# Patient Record
Sex: Male | Born: 1970 | Race: Black or African American | Hispanic: No | Smoking: Current every day smoker
Health system: Southern US, Community
[De-identification: ages and names within clinical notes are randomized; demographics above are authoritative.]

## PROBLEM LIST (undated history)

## (undated) DIAGNOSIS — I219 Acute myocardial infarction, unspecified: Secondary | ICD-10-CM

---

## 1998-02-16 ENCOUNTER — Emergency Department (HOSPITAL_COMMUNITY): Admission: EM | Admit: 1998-02-16 | Discharge: 1998-02-16 | Payer: Self-pay | Admitting: Emergency Medicine

## 1998-10-10 ENCOUNTER — Emergency Department (HOSPITAL_COMMUNITY): Admission: EM | Admit: 1998-10-10 | Discharge: 1998-10-10 | Payer: Self-pay | Admitting: Emergency Medicine

## 1999-08-07 ENCOUNTER — Inpatient Hospital Stay (HOSPITAL_COMMUNITY): Admission: EM | Admit: 1999-08-07 | Discharge: 1999-08-07 | Payer: Self-pay | Admitting: Emergency Medicine

## 2003-09-25 ENCOUNTER — Emergency Department (HOSPITAL_COMMUNITY): Admission: AD | Admit: 2003-09-25 | Discharge: 2003-09-26 | Payer: Self-pay | Admitting: Emergency Medicine

## 2007-01-08 ENCOUNTER — Emergency Department (HOSPITAL_COMMUNITY): Admission: EM | Admit: 2007-01-08 | Discharge: 2007-01-08 | Payer: Self-pay | Admitting: Emergency Medicine

## 2007-01-27 ENCOUNTER — Emergency Department (HOSPITAL_COMMUNITY): Admission: EM | Admit: 2007-01-27 | Discharge: 2007-01-27 | Payer: Self-pay | Admitting: Emergency Medicine

## 2007-03-31 ENCOUNTER — Emergency Department (HOSPITAL_COMMUNITY): Admission: EM | Admit: 2007-03-31 | Discharge: 2007-03-31 | Payer: Self-pay | Admitting: Family Medicine

## 2008-07-04 ENCOUNTER — Emergency Department (HOSPITAL_COMMUNITY): Admission: EM | Admit: 2008-07-04 | Discharge: 2008-07-04 | Payer: Self-pay | Admitting: Emergency Medicine

## 2009-05-14 ENCOUNTER — Emergency Department (HOSPITAL_COMMUNITY): Admission: EM | Admit: 2009-05-14 | Discharge: 2009-05-14 | Payer: Self-pay | Admitting: Emergency Medicine

## 2009-05-17 ENCOUNTER — Emergency Department (HOSPITAL_COMMUNITY): Admission: EM | Admit: 2009-05-17 | Discharge: 2009-05-17 | Payer: Self-pay | Admitting: Emergency Medicine

## 2009-06-02 ENCOUNTER — Emergency Department (HOSPITAL_COMMUNITY): Admission: EM | Admit: 2009-06-02 | Discharge: 2009-06-02 | Payer: Self-pay | Admitting: Emergency Medicine

## 2009-06-07 ENCOUNTER — Emergency Department (HOSPITAL_COMMUNITY): Admission: EM | Admit: 2009-06-07 | Discharge: 2009-06-07 | Payer: Self-pay | Admitting: Emergency Medicine

## 2009-06-16 ENCOUNTER — Emergency Department (HOSPITAL_COMMUNITY): Admission: EM | Admit: 2009-06-16 | Discharge: 2009-06-16 | Payer: Self-pay | Admitting: Emergency Medicine

## 2010-11-14 LAB — DIFFERENTIAL
Basophils Absolute: 0 10*3/uL (ref 0.0–0.1)
Basophils Absolute: 0 10*3/uL (ref 0.0–0.1)
Basophils Relative: 0 % (ref 0–1)
Basophils Relative: 0 % (ref 0–1)
Lymphocytes Relative: 34 % (ref 12–46)
Monocytes Absolute: 0.5 10*3/uL (ref 0.1–1.0)
Neutro Abs: 3.8 10*3/uL (ref 1.7–7.7)
Neutro Abs: 6.4 10*3/uL (ref 1.7–7.7)
Neutrophils Relative %: 63 % (ref 43–77)

## 2010-11-14 LAB — COMPREHENSIVE METABOLIC PANEL
Albumin: 4 g/dL (ref 3.5–5.2)
Alkaline Phosphatase: 49 U/L (ref 39–117)
BUN: 11 mg/dL (ref 6–23)
CO2: 24 mEq/L (ref 19–32)
Chloride: 104 mEq/L (ref 96–112)
GFR calc non Af Amer: >60 mL/min (ref >60)
Glucose, Bld: 103 mg/dL — ABNORMAL HIGH (ref 70–99)
Potassium: 3.9 mEq/L (ref 3.5–5.1)
Total Bilirubin: 1.5 mg/dL — ABNORMAL HIGH (ref 0.3–1.2)

## 2010-11-14 LAB — CBC
HCT: 46.4 % (ref 39.0–52.0)
Hemoglobin: 16.1 g/dL (ref 13.0–17.0)
MCHC: 34.3 g/dL (ref 30.0–36.0)
MCV: 90 fL (ref 78.0–100.0)
RBC: 5.08 MIL/uL (ref 4.22–5.81)
RBC: 5.15 MIL/uL (ref 4.22–5.81)
RDW: 13.4 % (ref 11.5–15.5)
WBC: 8.2 10*3/uL (ref 4.0–10.5)

## 2010-11-14 LAB — LIPASE, BLOOD: Lipase: 18 U/L (ref 11–59)

## 2010-11-14 LAB — URINALYSIS, ROUTINE W REFLEX MICROSCOPIC
Bilirubin Urine: NEGATIVE
Hgb urine dipstick: NEGATIVE
Specific Gravity, Urine: 1.018 (ref 1.005–1.030)
Urobilinogen, UA: 0.2 mg/dL (ref 0.0–1.0)
pH: 6.5 (ref 5.0–8.0)

## 2010-11-14 LAB — POCT I-STAT, CHEM 8
BUN: 10 mg/dL (ref 6–23)
Chloride: 107 mEq/L (ref 96–112)
HCT: 50 % (ref 39.0–52.0)
Potassium: 3.5 mEq/L (ref 3.5–5.1)

## 2010-11-14 LAB — HEMOCCULT GUIAC POC 1CARD (OFFICE): Fecal Occult Bld: POSITIVE

## 2010-11-14 LAB — PROTIME-INR: INR: 1.09 (ref 0.00–1.49)

## 2011-03-07 ENCOUNTER — Emergency Department (HOSPITAL_COMMUNITY): Payer: Self-pay

## 2011-03-07 ENCOUNTER — Emergency Department (HOSPITAL_COMMUNITY)
Admission: EM | Admit: 2011-03-07 | Discharge: 2011-03-08 | Disposition: A | Discharge status: Home / Self Care | Payer: No Typology Code available for payment source | Attending: Emergency Medicine | Admitting: Emergency Medicine

## 2011-03-07 DIAGNOSIS — M545 Low back pain, unspecified: Secondary | ICD-10-CM | POA: Insufficient documentation

## 2011-03-07 DIAGNOSIS — R42 Dizziness and giddiness: Secondary | ICD-10-CM | POA: Insufficient documentation

## 2011-03-07 DIAGNOSIS — M25529 Pain in unspecified elbow: Secondary | ICD-10-CM | POA: Insufficient documentation

## 2011-03-07 DIAGNOSIS — R404 Transient alteration of awareness: Secondary | ICD-10-CM | POA: Insufficient documentation

## 2011-03-07 DIAGNOSIS — IMO0002 Reserved for concepts with insufficient information to code with codable children: Secondary | ICD-10-CM | POA: Insufficient documentation

## 2011-03-07 DIAGNOSIS — M25579 Pain in unspecified ankle and joints of unspecified foot: Secondary | ICD-10-CM | POA: Insufficient documentation

## 2011-03-07 DIAGNOSIS — R51 Headache: Secondary | ICD-10-CM | POA: Insufficient documentation

## 2011-03-07 DIAGNOSIS — M542 Cervicalgia: Secondary | ICD-10-CM | POA: Insufficient documentation

## 2011-07-19 ENCOUNTER — Encounter: Payer: Self-pay | Admitting: *Deleted

## 2011-07-19 ENCOUNTER — Emergency Department (INDEPENDENT_AMBULATORY_CARE_PROVIDER_SITE_OTHER)
Admission: EM | Admit: 2011-07-19 | Discharge: 2011-07-19 | Disposition: A | Discharge status: Home / Self Care | Payer: Self-pay | Source: Home / Self Care | Attending: Emergency Medicine | Admitting: Emergency Medicine

## 2011-07-19 DIAGNOSIS — R42 Dizziness and giddiness: Secondary | ICD-10-CM

## 2011-07-19 DIAGNOSIS — R030 Elevated blood-pressure reading, without diagnosis of hypertension: Secondary | ICD-10-CM

## 2011-07-19 DIAGNOSIS — J069 Acute upper respiratory infection, unspecified: Secondary | ICD-10-CM

## 2011-07-19 LAB — POCT I-STAT, CHEM 8
BUN: 10 mg/dL (ref 6–23)
Calcium, Ion: 1.19 mmol/L (ref 1.12–1.32)
Creatinine, Ser: 1.1 mg/dL (ref 0.50–1.35)
Hemoglobin: 18 g/dL — ABNORMAL HIGH (ref 13.0–17.0)
TCO2: 28 mmol/L (ref 0–100)

## 2011-07-19 NOTE — ED Notes (Signed)
Pt with onset of cough congestion and fever Wednesday - fever resolved - now with mild cough and congestion and dizziness when bending over - pt requires note to return to work

## 2011-07-19 NOTE — ED Provider Notes (Signed)
History     CSN: [id number] Arrival date & time: 07/19/2011  9:55 AM   First MD Initiated Contact with Patient 07/19/11 [phone]      Chief Complaint  Patient presents with  . Cough  . Nasal Congestion  . Dizziness    (Consider location/radiation/quality/duration/timing/severity/associated sxs/prior treatment) HPI Comments: I feel the congestion its better, still coughing a bit last fevers was yesterday 100. Something" No SOB Just feel a bit dizzy when I move my head! Or bend over  Patient is a 40 y.o. male presenting with cough. The history is provided by the patient.  Cough This is a new problem. The current episode started 2 days ago. The problem occurs every few hours. The cough is non-productive. The maximum temperature recorded prior to his arrival was 100 to 100.9 F. Associated symptoms include ear congestion, ear pain and sore throat. Pertinent negatives include no chest pain, no chills, no sweats, no headaches, no rhinorrhea, no myalgias, no shortness of breath and no wheezing. Associated symptoms comments: Dizzy or things spinning with head movement and bending over". He has tried decongestants for the symptoms. The treatment provided mild relief. His past medical history does not include pneumonia, COPD or asthma.    History reviewed. No pertinent past medical history.  History reviewed. No pertinent past surgical history.  History reviewed. No pertinent family history.  History  Substance Use Topics  . Smoking status: Current Everyday Smoker  . Smokeless tobacco: Not on file  . Alcohol Use: Yes      Review of Systems  Constitutional: Negative for chills.  HENT: Positive for ear pain and sore throat. Negative for rhinorrhea.   Respiratory: Positive for cough. Negative for shortness of breath and wheezing.   Cardiovascular: Negative for chest pain and palpitations.  Musculoskeletal: Negative for myalgias.  Neurological: Positive for dizziness. Negative for tremors,  syncope, weakness, numbness and headaches.    Allergies  Review of patient's allergies indicates no known allergies.  Home Medications   No current outpatient prescriptions on file.  BP 136/102  Pulse 73  Temp 98.6 F (37 C)  Resp 16  SpO2 99%  Physical Exam  Constitutional: He is oriented to person, place, and time. He appears well-developed.  HENT:  Head: Normocephalic.  Right Ear: Tympanic membrane normal.  Left Ear: Tympanic membrane normal.  Nose: Nose normal.  Mouth/Throat: Uvula is midline, oropharynx is clear and moist and mucous membranes are normal.  Eyes: Pupils are equal, round, and reactive to light.  Neck: Normal range of motion. No JVD present. No thyromegaly present.  Cardiovascular: Normal rate, regular rhythm and normal heart sounds.  PMI is not displaced.   No murmur heard. Pulmonary/Chest: Effort normal and breath sounds normal. No respiratory distress. He has no decreased breath sounds. He has no wheezes. He has no rhonchi. He has no rales. He exhibits no tenderness.  Abdominal: Soft.  Musculoskeletal: Normal range of motion.  Neurological: He is alert and oriented to person, place, and time. He has normal strength. No cranial nerve deficit or sensory deficit. Coordination normal.  Skin: Skin is warm. He is not diaphoretic. No erythema.    ED Course  Procedures (including critical care time)  Labs Reviewed  POCT I-STAT, CHEM 8 - Abnormal; Notable for the following:    Glucose, Bld 105 (*)    Hemoglobin 18.0 (*)    HCT 53.0 (*)    All other components within normal limits  I-STAT, CHEM 8   No results found.  1. Upper respiratory infection   2. Vertigo   3. Elevated blood pressure       MDM  Resolving respiratory symptoms. Now feeling dizzy. Possibly mild dehydration vs peripheral vertigo.        Jimmie Molly, MD 07/19/11 1155

## 2012-03-31 ENCOUNTER — Emergency Department (HOSPITAL_COMMUNITY)
Admission: EM | Admit: 2012-03-31 | Discharge: 2012-03-31 | Disposition: A | Discharge status: Home / Self Care | Payer: Self-pay | Attending: Emergency Medicine | Admitting: Emergency Medicine

## 2012-03-31 ENCOUNTER — Encounter (HOSPITAL_COMMUNITY): Payer: Self-pay | Admitting: *Deleted

## 2012-03-31 DIAGNOSIS — S61409A Unspecified open wound of unspecified hand, initial encounter: Secondary | ICD-10-CM | POA: Insufficient documentation

## 2012-03-31 DIAGNOSIS — F17200 Nicotine dependence, unspecified, uncomplicated: Secondary | ICD-10-CM | POA: Insufficient documentation

## 2012-03-31 DIAGNOSIS — S61419A Laceration without foreign body of unspecified hand, initial encounter: Secondary | ICD-10-CM

## 2012-03-31 MED ORDER — HYDROCODONE-ACETAMINOPHEN 5-325 MG PO TABS
1.0000 | ORAL_TABLET | Freq: Once | ORAL | Status: AC
  Administered 2012-03-31: 1 via ORAL
  Filled 2012-03-31: qty 1

## 2012-03-31 MED ORDER — IBUPROFEN 600 MG PO TABS: 600.0000 mg | ORAL_TABLET | Freq: Four times a day (QID) | ORAL | Status: AC | PRN
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
## 2012-03-31 NOTE — ED Notes (Signed)
Pt sleeping deeply, but arousable.  Denies head trauma.  Full rom with digits.  Minimal bleeding under bandage.  Suture cart at bedside.
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
## 2012-03-31 NOTE — ED Provider Notes (Signed)
Medical screening examination/treatment/procedure(s) were performed by non-physician practitioner and as supervising physician I was immediately available for consultation/collaboration.  Derwood Kaplan, MD 03/31/12 [phone]

## 2014-10-28 ENCOUNTER — Emergency Department (HOSPITAL_COMMUNITY): Payer: Self-pay

## 2014-10-28 ENCOUNTER — Emergency Department (HOSPITAL_COMMUNITY)
Admission: EM | Admit: 2014-10-28 | Discharge: 2014-10-28 | Disposition: A | Discharge status: Home / Self Care | Payer: Self-pay | Attending: Emergency Medicine | Admitting: Emergency Medicine

## 2014-10-28 ENCOUNTER — Encounter (HOSPITAL_COMMUNITY): Payer: Self-pay | Admitting: *Deleted

## 2014-10-28 DIAGNOSIS — R079 Chest pain, unspecified: Secondary | ICD-10-CM | POA: Insufficient documentation

## 2014-10-28 DIAGNOSIS — F1410 Cocaine abuse, uncomplicated: Secondary | ICD-10-CM | POA: Insufficient documentation

## 2014-10-28 DIAGNOSIS — R0602 Shortness of breath: Secondary | ICD-10-CM | POA: Insufficient documentation

## 2014-10-28 DIAGNOSIS — I252 Old myocardial infarction: Secondary | ICD-10-CM | POA: Insufficient documentation

## 2014-10-28 DIAGNOSIS — R112 Nausea with vomiting, unspecified: Secondary | ICD-10-CM | POA: Insufficient documentation

## 2014-10-28 DIAGNOSIS — R61 Generalized hyperhidrosis: Secondary | ICD-10-CM | POA: Insufficient documentation

## 2014-10-28 DIAGNOSIS — Z79899 Other long term (current) drug therapy: Secondary | ICD-10-CM | POA: Insufficient documentation

## 2014-10-28 HISTORY — DX: Acute myocardial infarction, unspecified: I21.9

## 2014-10-28 LAB — CBC
HEMATOCRIT: 46.7 % (ref 39.0–52.0)
HEMOGLOBIN: 15.9 g/dL (ref 13.0–17.0)
MCH: 30.2 pg (ref 26.0–34.0)
MCHC: 34 g/dL (ref 30.0–36.0)
MCV: 88.8 fL (ref 78.0–100.0)
Platelets: 194 10*3/uL (ref 150–400)
RBC: 5.26 MIL/uL (ref 4.22–5.81)
RDW: 13.9 % (ref 11.5–15.5)
WBC: 8.2 10*3/uL (ref 4.0–10.5)

## 2014-10-28 LAB — BASIC METABOLIC PANEL
Anion gap: 11 (ref 5–15)
BUN: 12 mg/dL (ref 6–23)
CHLORIDE: 100 mmol/L (ref 96–112)
CO2: 22 mmol/L (ref 19–32)
Calcium: 9.4 mg/dL (ref 8.4–10.5)
Creatinine, Ser: 1.05 mg/dL (ref 0.50–1.35)
GFR calc Af Amer: >90 mL/min (ref >90)
GFR calc non Af Amer: 85 mL/min — ABNORMAL LOW (ref >90)
Glucose, Bld: 72 mg/dL (ref 70–99)
POTASSIUM: 3.7 mmol/L (ref 3.5–5.1)
Sodium: 133 mmol/L — ABNORMAL LOW (ref 135–145)

## 2014-10-28 LAB — I-STAT TROPONIN, ED
TROPONIN I, POC: 0 ng/mL (ref 0.00–0.08)
Troponin i, poc: 0 ng/mL (ref 0.00–0.08)

## 2014-10-28 LAB — BRAIN NATRIURETIC PEPTIDE: B Natriuretic Peptide: 9.3 pg/mL (ref 0.0–100.0)

## 2014-10-28 MED ORDER — ASPIRIN 81 MG PO CHEW
324.0000 mg | CHEWABLE_TABLET | Freq: Once | ORAL | Status: AC
  Administered 2014-10-28: 324 mg via ORAL
  Filled 2014-10-28: qty 4

## 2014-10-28 NOTE — ED Notes (Signed)
MD at the bedside  

## 2014-10-28 NOTE — ED Notes (Signed)
Pt reports being at work and onset of left side chest heaviness and pressure. Reports nausea and mild sob. Similar episodes have occurred in past. Airway intact and ekg done at triage.

## 2014-10-28 NOTE — Discharge Instructions (Signed)
Stop using cocaine.   Emergency Department Resource Guide 1) Find a Doctor and Pay Out of Pocket Although you won't have to find out who is covered by your insurance plan, it is a good idea to ask around and get recommendations. You will then need to call the office and see if the doctor you have chosen will accept you as a new patient and what types of options they offer for patients who are self-pay. Some doctors offer discounts or will set up payment plans for their patients who do not have insurance, but you will need to ask so you aren't surprised when you get to your appointment.  2) Contact Your Local Health Department Not all health departments have doctors that can see patients for sick visits, but many do, so it is worth a call to see if yours does. If you don't know where your local health department is, you can check in your phone book. The CDC also has a tool to help you locate your state's health department, and many state websites also have listings of all of their local health departments.  3) Find a Walk-in Clinic If your illness is not likely to be very severe or complicated, you may want to try a walk in clinic. These are popping up all over the country in pharmacies, drugstores, and shopping centers. They're usually staffed by nurse practitioners or physician assistants that have been trained to treat common illnesses and complaints. They're usually fairly quick and inexpensive. However, if you have serious medical issues or chronic medical problems, these are probably not your best option.  No Primary Care Doctor: - Call Health Connect at  [phone] - they can help you locate a primary care doctor that  accepts your insurance, provides certain services, etc. - Physician Referral Service- [phone]  Chronic Pain Problems: Organization         Address  Phone   Notes  [facility]  [phone] Patients need to be referred by their primary care doctor.    Medication Assistance: Organization         Address  Phone   Notes  [facility] [address] [place] [place], [place] [address] [phone] --Must be a resident of [facility] -- Must have NO insurance coverage whatsoever (no Medicaid/ Medicare, etc.) -- The pt. MUST have a primary care doctor that directs their care regularly and follows them in the community   Med[facility]ist  [phone]   [employer]  [phone]    Agencies that provide inexpensive medical care: Organization         Address  Phone   Notes  [facility]  [phone]   [facility]    [phone]   [facility] [address] [place], [place] [address] [phone]   [facility] [address] [place] [place] [phone]   Planned Parenthood    [phone]   [facility]    [phone]   [facility]  [address], [place] Phone:  [phone], Fax:  [phone] Hours of Operation:  9 am - 6 pm, M-F.  Also accepts Medicaid/Medicare and self-pay.  [facility]  [address], [place] Phone: [phone], Fax: [phone]. Hours of Operation:  8:30 am - 5:30 pm, M-F.  Also accepts Medicaid and self-pay.  [facility] [place] [address], [place] Phone: [phone]   [place], [place], [place] [phone], Ext. [phone] Mondays & Thursdays: 7-9 AM.  First 15 patients are seen on a first come, first serve basis.    [place] Providers:  Organization         Address  Phone   Notes  [facility] [address], [place] [phone] Also accepts self-pay patients.  [facility] [address], [place]  [phone]   [place], [address], [place] [phone]   [facility] [address], [place] [phone]   Lucianne Lei [address], [place]   [phone] Only accepts [place] Access [place] patients after they have their name applied to their card.   Self-Pay (no insurance) in [facility]:  Organization         Address  Phone   Notes  Sickle Cell Patients, [facility] Internal [facility] [place] [phone]   [facility] [place] [phone]   [facility] [place]  [place], [place], [place] [phone]   [facility]/Dr. Osei-Bonsu  [address] [place] or [address], [place] [phone] Phone number for both [place] and [place] locations is the same.  [facility] [address] [place] [phone]   [facility] [address] [place] or [address] [phone]   [facility] [address] [place] [phone], phone; [phone], fax Sees patients 1st and 3rd Saturday of every month.  Must not qualify for public or private insurance (i.e. Medicaid, Medicare, [place] Health Choice, Veterans' Benefits)  Household income should be no more than 200% of the poverty level The clinic cannot treat you if you are pregnant or think you are pregnant  Sexually transmitted diseases are not treated at the clinic.    Dental Care: Organization         Address  Phone  Notes  [facility] Department of [facility] [phone] Accepts children up to age 14 who are enrolled in [place] or [place]; pregnant women with a Medicaid card; and children who have applied for Medicaid or [place] Health Choice, but were declined, whose parents can pay a reduced fee at time of service.    [facility] Department of [facility]  [address], [place] [phone] Accepts children up to age 38 who are enrolled in [place] or [place]; pregnant women with a Medicaid card; and children who have applied for Medicaid or [place] Health Choice, but were declined, whose parents can pay a reduced fee at time of service.  [facility] Adult [facility] Access PROGRAM  [place] [phone] Patients are seen by appointment only. Walk-ins are not accepted. [place] will see patients 47 years of age and older. Monday - Tuesday (8am-5pm) Most Wednesdays (8:30-5pm) $30 per visit, cash only  [facility] Adult Dental Access PROGRAM  [address], [facility] [phone] Patients are seen by appointment only. Walk-ins are not accepted. [place] will see patients 18 years  of age and older. One Wednesday Evening (Monthly: Volunteer Based).  $30 per visit, cash only  [place]  [phone] for adults; Children under age 87, call Graduate Pediatric Dentistry at [phone]. Children aged 20-14, please call [phone] to request a pediatric application.  Dental services are provided in all areas of dental care including fillings, crowns and bridges, complete and partial dentures, implants, gum treatment, root canals, and extractions. Preventive care is also provided. Treatment is provided to both adults and children. Patients are selected via a lottery and there is often a waiting list.   [facility] [address], [place]  [phone] [email or web address]   [facility] [address] [place], [place] [phone], Ext. [phone] Second and Fourth Thursday of each month, opens at 6:30 AM; Clinic ends at 9 AM.  Patients are seen on a first-come first-served basis, and a limited number are seen during each clinic.   [facility]  [address] [place], [place] [phone]   Eligibility Requirements You must have lived in [place], [place], or [place] counties for at least the last three months.   You cannot be eligible for state or federal sponsored [employer], including [employer], [place], or [employer].   You generally cannot be eligible for healthcare insurance through your employer.    How to apply: Eligibility screenings are held every Tuesday and Wednesday afternoon from 1:00 pm until 4:00 pm. You do not need an appointment for the interview!  [facility] [address] [place], [place]   [place]  Wells Department  [place]  [phone]    Behavioral Health Resources in the Community: Intensive Outpatient Programs Organization         Address  Phone  Notes  [facility] [place]. [address] [place], [place] [phone]   [facility] [address] [place], [place]   ADS: Alcohol & Drug S[facility]cs [address], [place], [place]   Lupus [address]  [place], [place] or [phone]   Substance Abuse Resources Organization         Address  Phone  Notes  Alcohol and Drug Services  [phone]   [place]  [phone]   The [place]   Chinita Pester  [phone]   Residential & Outpatient Substance Abuse Program  [phone]   Psychological Services Organization         Address  Phone  Notes  [facility]  [place]  [phone]   [place] [address], [place] or [phone]    Mobile Crisis Teams Organization         Address  Phone  Notes  [facility], Mobile Crisis Care Unit  [phone]   [facility]  [address] [place], [place]   [place] [address], [place] [phone]    Self-Help/Support Groups Organization         Address  Phone             Notes  [place]. of [place] - variety of support groups  [place] Call for more information  Narcotics Anonymous (NA), [facility] [address] [place]  2 meetings at this location   Residential Treatment Programs Organization         Address  Phone  Notes  [facility] [address],    [place]  [phone]   [facility]  [address], [place] [phone], [place], [place] [phone]   [facility] [address], [place] [phone] Admissions: 8am-3pm M-F  [facility] [address]    [place], [place] [phone]   [facility] [address], [place], [place] [phone]   [facility] [address]  [place], [place] [phone]   [facility] - Intensive Outpatient [address], [place], [place] [phone]   [facility] ([facility] [address]  [place], [place] [phone] or [phone]   [facility] (RT[facility]) [address] [place], [place] [phone] Accepts Medicaid  [facility] [address]  [place] [phone] Substance Abuse/Addiction Treatment   [facility] Behavioral Health Resources Organization         Address  Phone  Notes  [facility]  [phone]   Angie FavaJulie Brannon, PhD [address] [place], [place]   [phone] or [phone](336) [phone]   [facility] [place]   [address] [place], [place] [phone]) [phone]   [facility] [address], [place], [place] [phone]) [phone] Insurance/Medicaid/sponsorship through [facility]  [facility] [address]                                    [place], [place] [phone]) [phone]  Therapy/tele-psych/case  [facility] [address] [place].   [place], [place] [phone]) [phone]    Dr. Lolly MustacheArfeen  [phone]) [phone]   [facility] [place]  [employer] [facility] 1) [address], [place] 2) [address], [place] 3)  [address], [place] [phone]) [phone]) [phone]  [phone](336) [phone]   [facility] Child Abuse Hotline [phone](336) [phone] or [phone]) [phone] (After Hours)

## 2014-10-28 NOTE — ED Notes (Signed)
Phlebotomy at the bedside  

## 2014-10-28 NOTE — ED Provider Notes (Signed)
CSN: [id number]     Arrival date & time 10/28/14  1441 History   None    Chief Complaint  Patient presents with  . Chest Pain     (Consider location/radiation/quality/duration/timing/severity/associated sxs/prior Treatment) Patient is a 44 y.o. male presenting with chest pain. The history is provided by the patient.  Chest Pain Pain location:  L chest Pain quality: pressure   Pain radiates to:  Does not radiate Pain radiates to the back: no   Pain severity:  Moderate Onset quality:  Sudden Duration:  2 hours Timing:  Constant Progression:  Resolved Chronicity:  New Context: drug use (cocaine) and at rest   Relieved by:  None tried Worsened by:  Nothing tried Ineffective treatments:  None tried Associated symptoms: diaphoresis, nausea and shortness of breath   Associated symptoms: no cough, no fever and no numbness   Risk factors: no coronary artery disease, no diabetes mellitus, no high cholesterol, no hypertension and no prior DVT/PE     Past Medical History  Diagnosis Date  . MI (myocardial infarction)    History reviewed. No pertinent past surgical history. History reviewed. No pertinent family history. History  Substance Use Topics  . Smoking status: Current Every Day Smoker -- 0.30 packs/day    Types: Cigarettes  . Smokeless tobacco: Not on file  . Alcohol Use: Yes     Comment: social    Review of Systems  Constitutional: Positive for diaphoresis. Negative for fever.  Respiratory: Positive for shortness of breath. Negative for cough.   Cardiovascular: Positive for chest pain.  Gastrointestinal: Positive for nausea.  Neurological: Negative for numbness.  All other systems reviewed and are negative.     Allergies  Cinnamon  Home Medications   Prior to Admission medications   Medication Sig Start Date End Date Taking? Authorizing Provider  Acetaminophen (TYLENOL PO) Take 2 capsules by mouth once.   Yes Historical Provider, MD  ibuprofen  (ADVIL,MOTRIN) 200 MG tablet Take 600 mg by mouth daily as needed (pain).   Yes Historical Provider, MD  Multiple Vitamin (MULTIVITAMIN WITH MINERALS) TABS tablet Take 1 tablet by mouth daily.   Yes Historical Provider, MD   BP 129/93 mmHg  Pulse 78  Temp(Src) 98.8 F (37.1 C) (Oral)  Resp 16  Ht 6\' 3"  (1.905 m)  Wt 185 lb (83.915 kg)  BMI 23.12 kg/m2  SpO2 97% Physical Exam  Constitutional: He is oriented to person, place, and time. He appears well-developed and well-nourished. No distress.  HENT:  Head: Normocephalic and atraumatic.  Mouth/Throat: Oropharynx is clear and moist. No oropharyngeal exudate.  Eyes: EOM are normal. Pupils are equal, round, and reactive to light.  Cardiovascular: Normal rate, regular rhythm, normal heart sounds and intact distal pulses.  Exam reveals no gallop and no friction rub.   No murmur heard. Pulmonary/Chest: Effort normal and breath sounds normal. No respiratory distress. He has no wheezes. He has no rales.  Abdominal: Soft. He exhibits no distension and no mass. There is no tenderness. There is no rebound and no guarding.  Musculoskeletal: Normal range of motion. He exhibits no edema or tenderness.  Neurological: He is alert and oriented to person, place, and time. No cranial nerve deficit.  Skin: Skin is warm and dry. No rash noted. He is not diaphoretic.  Psychiatric: He has a normal mood and affect. His behavior is normal. Judgment and thought content normal.  Nursing note and vitals reviewed.   ED Course  Procedures (including critical care time) Labs  Review Labs Reviewed  BASIC METABOLIC PANEL - Abnormal; Notable for the following:    Sodium 133 (*)    GFR calc non Af Amer 85 (*)    All other components within normal limits  CBC  BRAIN NATRIURETIC PEPTIDE  I-STAT TROPOININ, ED  Rosezena Sensor, ED    Imaging Review Dg Chest 2 View  10/28/2014   CLINICAL DATA:  Chest pain  EXAM: CHEST  2 VIEW  COMPARISON:  09/26/03  FINDINGS: The  heart size and mediastinal contours are within normal limits. Both lungs are clear. The visualized skeletal structures are unremarkable.  IMPRESSION: No active cardiopulmonary disease.   Electronically Signed   By: Alcide Clever M.D.   On: 10/28/2014 15:10     EKG Interpretation None      MDM   Final diagnoses:  Chest pain, unspecified chest pain type  Cocaine abuse    44 year old male with a past medical history cocaine abuse presents with chest pressure, shortness of breath, nausea. Symptoms started at 11 AM and resolved after 1.5 hours. Asymptomatic on arrival. Last reported cocaine use 2 days ago.  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 Is otherwise a healthy male with no hypertension, hyperlipidemia, or other cardiac risk factors. Afebrile and he was stable on arrival. Nonischemic EKG. Chest x-ray is without consolidation, effusion, widening this time, pneumothorax. Chest pain likely secondary to cocaine use. Initial troponin negative and a troponin will be obtained at 6:00 PM. Laboratory workup otherwise unremarkable. Advised discontinuation of drug use and PCP follow-up.  Delta troponin negative. Advised him to discontinue drug use. No chest pain since arrival. Stable for discharge.  Dorna Leitz, MD 10/28/14 2012  Margarita Grizzle, MD 10/29/14 [phone]

## 2017-12-25 ENCOUNTER — Encounter (HOSPITAL_COMMUNITY): Payer: Self-pay | Admitting: Emergency Medicine

## 2017-12-25 ENCOUNTER — Other Ambulatory Visit: Payer: Self-pay

## 2017-12-25 ENCOUNTER — Emergency Department (HOSPITAL_COMMUNITY)
Admission: EM | Admit: 2017-12-25 | Discharge: 2017-12-25 | Disposition: A | Discharge status: Home / Self Care | Payer: Self-pay | Attending: Emergency Medicine | Admitting: Emergency Medicine

## 2017-12-25 DIAGNOSIS — H538 Other visual disturbances: Secondary | ICD-10-CM | POA: Insufficient documentation

## 2017-12-25 DIAGNOSIS — R42 Dizziness and giddiness: Secondary | ICD-10-CM | POA: Insufficient documentation

## 2017-12-25 DIAGNOSIS — Z5321 Procedure and treatment not carried out due to patient leaving prior to being seen by health care provider: Secondary | ICD-10-CM | POA: Insufficient documentation

## 2017-12-25 DIAGNOSIS — R51 Headache: Secondary | ICD-10-CM | POA: Insufficient documentation

## 2017-12-25 LAB — BASIC METABOLIC PANEL
ANION GAP: 14 (ref 5–15)
BUN: 9 mg/dL (ref 6–20)
CALCIUM: 9.3 mg/dL (ref 8.9–10.3)
CO2: 22 mmol/L (ref 22–32)
Chloride: 101 mmol/L (ref 101–111)
Creatinine, Ser: 1 mg/dL (ref 0.61–1.24)
GFR calc non Af Amer: >60 mL/min (ref >60)
Glucose, Bld: 78 mg/dL (ref 65–99)
Potassium: 3.3 mmol/L — ABNORMAL LOW (ref 3.5–5.1)
Sodium: 137 mmol/L (ref 135–145)

## 2017-12-25 LAB — CBC
HCT: 45.5 % (ref 39.0–52.0)
HEMOGLOBIN: 15.5 g/dL (ref 13.0–17.0)
MCH: 30.4 pg (ref 26.0–34.0)
MCHC: 34.1 g/dL (ref 30.0–36.0)
MCV: 89.2 fL (ref 78.0–100.0)
Platelets: 201 10*3/uL (ref 150–400)
RBC: 5.1 MIL/uL (ref 4.22–5.81)
RDW: 13.8 % (ref 11.5–15.5)
WBC: 4.8 10*3/uL (ref 4.0–10.5)

## 2017-12-25 NOTE — ED Notes (Signed)
No answer after called x3

## 2017-12-25 NOTE — ED Triage Notes (Signed)
Patient complains of headaches, dizziness, blurred vision, and tingling in his extremities x3 days. Patient states he is concerned that his blood pressure is elevated, BP 162/98. No history of hypertension.

## 2020-04-24 ENCOUNTER — Emergency Department (HOSPITAL_COMMUNITY)
Admission: EM | Admit: 2020-04-24 | Discharge: 2020-04-24 | Disposition: A | Discharge status: Home / Self Care | Payer: HRSA Program | Attending: Emergency Medicine | Admitting: Emergency Medicine

## 2020-04-24 ENCOUNTER — Encounter (HOSPITAL_COMMUNITY): Payer: Self-pay

## 2020-04-24 ENCOUNTER — Other Ambulatory Visit: Payer: Self-pay

## 2020-04-24 ENCOUNTER — Telehealth (HOSPITAL_COMMUNITY): Payer: Self-pay | Admitting: Family

## 2020-04-24 ENCOUNTER — Emergency Department (HOSPITAL_COMMUNITY): Payer: HRSA Program

## 2020-04-24 DIAGNOSIS — J3489 Other specified disorders of nose and nasal sinuses: Secondary | ICD-10-CM | POA: Diagnosis present

## 2020-04-24 DIAGNOSIS — F17210 Nicotine dependence, cigarettes, uncomplicated: Secondary | ICD-10-CM | POA: Insufficient documentation

## 2020-04-24 DIAGNOSIS — U071 COVID-19: Secondary | ICD-10-CM | POA: Diagnosis not present

## 2020-04-24 LAB — SARS CORONAVIRUS 2 BY RT PCR (HOSPITAL ORDER, PERFORMED IN ~~LOC~~ HOSPITAL LAB): SARS Coronavirus 2: POSITIVE — AB

## 2020-04-24 MED ORDER — BENZONATATE 100 MG PO CAPS: 100.0000 mg | ORAL_CAPSULE | Freq: Three times a day (TID) | ORAL | 0 refills | Status: AC

## 2020-04-24 MED ORDER — ACETAMINOPHEN 325 MG PO TABS
650.0000 mg | ORAL_TABLET | Freq: Once | ORAL | Status: AC
  Administered 2020-04-24: 650 mg via ORAL
  Filled 2020-04-24: qty 2

## 2020-04-24 NOTE — ED Provider Notes (Addendum)
[facility]-EMERGENCY DEPT Provider Note   CSN: [id number] Arrival date & time: 04/24/20  0743     History Chief Complaint  Patient presents with  . Covid Exposure    Gabriel Mcgee is a 49 y.o. male presents today requesting COVID-19 test.  Patient reports that his girlfriend tested positive for COVID-19 this week and received a monoclonal antibody infusion yesterday.  Patient reports that he had been feeling well until last night he developed rhinorrhea, body aches, fatigue, fever/chills and a nonproductive cough.  He reports the symptoms have continued to worsen since yesterday, he has not taken any medication for his symptoms.  He describes body aches as a diffuse aching sensation throughout his entire body, nonradiating, no aggravating or alleviating factors, moderate in intensity.  He describes moderate intensity generalized headache constant without aggravating or alleviating factors.  He describes his cough as nonproductive associated with some shortness of breath when coughing.  Denies vision changes, fall/injury, neck pain/stiffness, sore throat, hemoptysis, chest pain, abdominal pain, vomiting, diarrhea, extremity swelling/color change, history of blood clot, recent surgery/immobilization, history of cancer, exogenous hormone use or any additional concerns.  Patient received 1 dose of Moderna COVID-19 vaccine in April 2021.  Reports he did not receive the second dose because he was in the process of moving.  HPI     Past Medical History:  Diagnosis Date  . MI (myocardial infarction) (HCC)     There are no problems to display for this patient.   History reviewed. No pertinent surgical history.     History reviewed. No pertinent family history.  Social History   Tobacco Use  . Smoking status: Current Every Day Smoker    Packs/day: 0.30    Types: Cigarettes  Substance Use Topics  . Alcohol use: Yes    Comment: social  . Drug use: No    Home  Medications Prior to Admission medications   Medication Sig Start Date End Date Taking? Authorizing Provider  Acetaminophen (TYLENOL PO) Take 2 capsules by mouth once.    [provider]  benzonatate (TESSALON) 100 MG capsule Take 1 capsule (100 mg total) by mouth every 8 (eight) hours. 04/24/20   Harlene Salts A, PA-C  ibuprofen (ADVIL,MOTRIN) 200 MG tablet Take 600 mg by mouth daily as needed (pain).    [provider]  Multiple Vitamin (MULTIVITAMIN WITH MINERALS) TABS tablet Take 1 tablet by mouth daily.    [provider]    Allergies    Cinnamon  Review of Systems   Review of Systems  Constitutional: Positive for chills and fever.  HENT: Positive for rhinorrhea. Negative for sore throat.   Eyes: Negative.  Negative for visual disturbance.  Respiratory: Positive for cough and shortness of breath (with cough).   Cardiovascular: Negative.  Negative for chest pain and leg swelling.  Gastrointestinal: Negative.  Negative for abdominal pain, diarrhea, nausea and vomiting.  Musculoskeletal: Positive for arthralgias and myalgias.  Neurological: Positive for headaches. Negative for syncope, weakness and numbness.  Marland Kitchen  Physical Exam Updated Vital Signs BP (!) 144/102 (BP Location: Right Arm)   Pulse 85   Temp (!) 100.5 F (38.1 C) (Oral)   Resp 18   SpO2 97%   Physical Exam Constitutional:      General: He is not in acute distress.    Appearance: Normal appearance. He is well-developed. He is not ill-appearing or diaphoretic.  HENT:     Head: Normocephalic and atraumatic.     Jaw:  There is normal jaw occlusion.     Right Ear: External ear normal.     Left Ear: External ear normal.     Nose: Rhinorrhea present. Rhinorrhea is clear.     Right Sinus: No maxillary sinus tenderness or frontal sinus tenderness.     Left Sinus: No maxillary sinus tenderness or frontal sinus tenderness.     Mouth/Throat:     Mouth: Mucous membranes are moist.      Pharynx: Oropharynx is clear.  Eyes:     General: Vision grossly intact. Gaze aligned appropriately.     Extraocular Movements: Extraocular movements intact.     Conjunctiva/sclera: Conjunctivae normal.     Pupils: Pupils are equal, round, and reactive to light.     Comments: EOMI, no pain with extraocular motion.  Neck:     Trachea: Trachea and phonation normal. No tracheal tenderness or tracheal deviation.     Meningeal: Brudzinski's sign absent.  Cardiovascular:     Rate and Rhythm: Normal rate and regular rhythm.  Pulmonary:     Effort: Pulmonary effort is normal. No respiratory distress.     Breath sounds: Normal breath sounds and air entry.  Abdominal:     General: There is no distension.     Palpations: Abdomen is soft.     Tenderness: There is no abdominal tenderness. There is no guarding or rebound.  Musculoskeletal:        General: Normal range of motion.     Cervical back: Normal range of motion and neck supple. No edema or rigidity.     Right lower leg: No edema.     Left lower leg: No edema.  Skin:    General: Skin is warm and dry.  Neurological:     Mental Status: He is alert.     GCS: GCS eye subscore is 4. GCS verbal subscore is 5. GCS motor subscore is 6.     Comments: Speech is clear and goal oriented, follows commands Major Cranial nerves without deficit, no facial droop Moves extremities without ataxia, coordination intact  Psychiatric:        Behavior: Behavior normal.     ED Results / Procedures / Treatments   Labs (all labs ordered are listed, but only abnormal results are displayed) Labs Reviewed  SARS CORONAVIRUS 2 BY RT PCR (HOSPITAL ORDER, PERFORMED IN [facility]) - Abnormal; Notable for the following components:      Result Value   SARS Coronavirus 2 POSITIVE (*)    All other components within normal limits    EKG EKG Interpretation  Date/Time:  Tuesday April 24 2020 12:54:04 EDT Ventricular Rate:  87 PR Interval:      QRS Duration: 94 QT Interval:  379 QTC Calculation: 456 R Axis:   57 Text Interpretation: Sinus rhythm 12 Lead; Mason-Likar No significant change since prior 5/19 Confirmed by Meridee Score [phone]) on 04/24/2020 1:06:05 PM   Radiology DG Chest 2 View  Result Date: 04/24/2020 CLINICAL DATA:  COVID exposure.  Cough, shortness of breath EXAM: CHEST - 2 VIEW COMPARISON:  10/28/2014 FINDINGS: The heart size and mediastinal contours are within normal limits. Both lungs are clear. The visualized skeletal structures are unremarkable. IMPRESSION: Normal study. Electronically Signed   By: Charlett Nose M.D.   On: 04/24/2020 09:11    Procedures Procedures (including critical care time)  Medications Ordered in ED Medications  acetaminophen (TYLENOL) tablet 650 mg (650 mg Oral Given 04/24/20 1227)    ED Course  I have reviewed the triage vital signs and the nursing notes.  Pertinent labs & imaging results that were available during my care of the patient were reviewed by me and considered in my medical decision making (see chart for details).    MDM Rules/Calculators/A&P                         Additional history obtained from: 1. Nursing notes from this visit. -------------------------------------------- COVID test Positive  EKG: Sinus rhythm 12 Lead; Mason-Likar No significant change since prior 5/19 Confirmed by Meridee Score [phone]) on 04/24/2020 1:06:05 PM  CXR:    IMPRESSION:  Normal study.   History and physical and work-up is consistent with COVID-19 viral infection.  No evidence of bacterial infection to require antibiotics at this time.  Additionally low suspicion for meningitis, encephalitis, ACS, PE, anemia, dissection or other emergent etiologies of his symptoms.  Of his symptoms appear mild at this time with a headache, body aches, nonproductive cough, rhinorrhea.  He has not been trying any medications at home.  He had low-grade fever in the ER treated with Tylenol.   Cranial nerves intact, normal neurologic exam, airway clear, no meningeal signs, cardiopulmonary exam within normal limits, abdomen soft nontender, neurovascular tact all 4 extremities without evidence of DVT. Ambulated by nursing staff on RA without hypoxia. He is overall well-appearing and in no acute distress.  I referred patient for monoclonal antibody infusion through secure chat, patient aware to keep his phone on him when they call to schedule an infusion.  At this time there does not appear to be any evidence of an acute emergency medical condition and the patient appears stable for discharge with appropriate outpatient follow up. Diagnosis was discussed with patient who verbalizes understanding of care plan and is agreeable to discharge. I have discussed return precautions with patient who verbalizes understanding. Patient encouraged to follow-up with their PCP. All questions answered.  Patient's case discussed with Dr. Charm Barges who agrees with plan to discharge with follow-up.   Gabriel Mcgee was evaluated in Emergency Department on 04/24/2020 for the symptoms described in the history of present illness. He was evaluated in the context of the global COVID-19 pandemic, which necessitated consideration that the patient might be at risk for infection with the SARS-CoV-2 virus that causes COVID-19. Institutional protocols and algorithms that pertain to the evaluation of patients at risk for COVID-19 are in a state of rapid change based on information released by regulatory bodies including the CDC and federal and state organizations. These policies and algorithms were followed during the patient's care in the ED.   Note: Portions of this report may have been transcribed using voice recognition software. Every effort was made to ensure accuracy; however, inadvertent computerized transcription errors may still be present. Final Clinical Impression(s) / ED Diagnoses Final diagnoses:  COVID-19 virus  infection    Rx / DC Orders ED Discharge Orders         Ordered    benzonatate (TESSALON) 100 MG capsule  Every 8 hours        04/24/20 1332           Elizabeth Palau 04/24/20 1338    Terrilee Files, MD 04/24/20 1759

## 2020-04-24 NOTE — ED Notes (Signed)
Patient ambulated with this RN, SpO2 remained at 99-100% RA

## 2020-04-24 NOTE — Telephone Encounter (Signed)
Called to discuss with Beaulah Dinning about Covid symptoms and possible candidacy for the use of casirivimab/imdevimab, a combination monoclonal antibody infusion for those with mild to moderate Covid symptoms and at a high risk of hospitalization.     Unable to reach patient to determine candidacy. VM left and my chart message sent.   Willa Brocks,NP

## 2020-04-24 NOTE — ED Triage Notes (Signed)
Pt states that his girlfriend recently tested positive for COVID. Pt states that last night, he started having a cough, headache, chills, etc. Pt in NAD

## 2020-04-24 NOTE — Discharge Instructions (Addendum)
At this time there does not appear to be the presence of an emergent medical condition, however there is always the potential for conditions to change. Please read and follow the below instructions.  Please return to the Emergency Department immediately for any new or worsening symptoms. Please be sure to follow up with your Primary Care Provider within one week regarding your visit today; please call their office to schedule an appointment even if you are feeling better for a follow-up visit. You have been referred to the monoclonal antibody infusion center.  Please keep your phone on you, they should call you in the next 1-2 days to schedule you an appointment for infusion.  Please stay away from others to avoid spread of COVID-19 viral and wear your mask.  Please drink plenty of water to avoid dehydration and get plenty of rest.  You may use the medication Tessalon as prescribed to help with your cough.  You may use over-the-counter anti-inflammatory such as Tylenol to help with fever. Additionally your blood pressure was slightly elevated in the ER today, please have it rechecked by your primary care doctor at your follow-up visit and discuss medication management if it is indicated with them at that time.  Get help right away if: You have trouble breathing. You have pain or pressure in your chest. You have confusion. You have bluish lips and fingernails. You have difficulty waking from sleep. You have neck stiffness You have vomiting and cannot keep down food or water You have any new/concerning or worsening of symptoms These symptoms may represent a serious problem that is an emergency. Do not wait to see if the symptoms will go away. Get medical help right away. Call your local emergency services (911 in the [place] Do not drive yourself to the hospital. Let the emergency medical personnel know if you think you have COVID-19.  Please read the additional information packets attached to your  discharge summary.  Do not take your medicine if  develop an itchy rash, swelling in your mouth or lips, or difficulty breathing; call 911 and seek immediate emergency medical attention if this occurs.  You may review your lab tests and imaging results in their entirety on your MyChart account.  Please discuss all results of fully with your primary care provider and other specialist at your follow-up visit.  Note: Portions of this text may have been transcribed using voice recognition software. Every effort was made to ensure accuracy; however, inadvertent computerized transcription errors may still be present.

## 2020-04-25 ENCOUNTER — Other Ambulatory Visit: Payer: Self-pay | Admitting: Unknown Physician Specialty

## 2020-04-25 ENCOUNTER — Telehealth: Payer: Self-pay | Admitting: Unknown Physician Specialty

## 2020-04-25 DIAGNOSIS — U071 COVID-19: Secondary | ICD-10-CM

## 2020-04-25 NOTE — Telephone Encounter (Signed)
I connected by phone with Gabriel Mcgee on 04/25/2020 at 6:28 PM to discuss the potential use of a new treatment for mild to moderate COVID-19 viral infection in non-hospitalized patients.  This patient is a 49 y.o. male that meets the FDA criteria for Emergency Use Authorization of COVID monoclonal antibody casirivimab/imdevimab.  Has a (+) direct SARS-CoV-2 viral test result  Has mild or moderate COVID-19   Is NOT hospitalized due to COVID-19  Is within 10 days of symptom onset  Has at least one of the high risk factor(s) for progression to severe COVID-19 and/or hospitalization as defined in EUA.  Specific high risk criteria : high risk community   I have spoken and communicated the following to the patient or parent/caregiver regarding COVID monoclonal antibody treatment:  1. FDA has authorized the emergency use for the treatment of mild to moderate COVID-19 in adults and pediatric patients with positive results of direct SARS-CoV-2 viral testing who are 55 years of age and older weighing at least 40 kg, and who are at high risk for progressing to severe COVID-19 and/or hospitalization.  2. The significant known and potential risks and benefits of COVID monoclonal antibody, and the extent to which such potential risks and benefits are unknown.  3. Information on available alternative treatments and the risks and benefits of those alternatives, including clinical trials.  4. Patients treated with COVID monoclonal antibody should continue to self-isolate and use infection control measures (e.g., wear mask, isolate, social distance, avoid sharing personal items, clean and disinfect "high touch" surfaces, and frequent handwashing) according to CDC guidelines.   5. The patient or parent/caregiver has the option to accept or refuse COVID monoclonal antibody treatment.  After reviewing this information with the patient, The patient agreed to proceed with receiving casirivimab\imdevimab  infusion and will be provided a copy of the Fact sheet prior to receiving the infusion. Gabriel Cirri 04/25/2020 6:28 PM  Sx onset 9/13

## 2020-04-26 ENCOUNTER — Ambulatory Visit (HOSPITAL_COMMUNITY)
Admission: RE | Admit: 2020-04-26 | Discharge: 2020-04-26 | Disposition: A | Discharge status: Home / Self Care | Payer: HRSA Program | Source: Ambulatory Visit | Attending: Pulmonary Disease | Admitting: Pulmonary Disease

## 2020-04-26 DIAGNOSIS — U071 COVID-19: Secondary | ICD-10-CM | POA: Diagnosis present

## 2020-04-26 MED ORDER — ALBUTEROL SULFATE HFA 108 (90 BASE) MCG/ACT IN AERS: 2.0000 | INHALATION_SPRAY | Freq: Once | RESPIRATORY_TRACT | Status: DC | PRN

## 2020-04-26 MED ORDER — SODIUM CHLORIDE 0.9 % IV SOLN
1200.0000 mg | Freq: Once | INTRAVENOUS | Status: AC
  Administered 2020-04-26: 1200 mg via INTRAVENOUS

## 2020-04-26 MED ORDER — METHYLPREDNISOLONE SODIUM SUCC 125 MG IJ SOLR: 125.0000 mg | Freq: Once | INTRAMUSCULAR | Status: DC | PRN

## 2020-04-26 MED ORDER — SODIUM CHLORIDE 0.9 % IV SOLN: INTRAVENOUS | Status: DC | PRN

## 2020-04-26 MED ORDER — EPINEPHRINE 0.3 MG/0.3ML IJ SOAJ: 0.3000 mg | Freq: Once | INTRAMUSCULAR | Status: DC | PRN

## 2020-04-26 MED ORDER — FAMOTIDINE IN NACL 20-0.9 MG/50ML-% IV SOLN: 20.0000 mg | Freq: Once | INTRAVENOUS | Status: DC | PRN

## 2020-04-26 MED ORDER — DIPHENHYDRAMINE HCL 50 MG/ML IJ SOLN: 50.0000 mg | Freq: Once | INTRAMUSCULAR | Status: DC | PRN

## 2020-04-26 NOTE — Progress Notes (Signed)
  Diagnosis: COVID-19  Physician:Dr. Wright Procedure: Covid Infusion Clinic Med: casirivimab\imdevimab infusion - Provided patient with casirivimab\imdevimab fact sheet for patients, parents and caregivers prior to infusion.  Complications: No immediate complications noted.  Discharge: Discharged home   Gabriel Mcgee S Shoaib Siefker 04/26/2020  

## 2020-04-26 NOTE — Discharge Instructions (Signed)

## 2020-10-10 ENCOUNTER — Emergency Department (HOSPITAL_COMMUNITY)
Admission: EM | Admit: 2020-10-10 | Discharge: 2020-10-11 | Disposition: A | Discharge status: Home / Self Care | Payer: Self-pay | Attending: Emergency Medicine | Admitting: Emergency Medicine

## 2020-10-10 ENCOUNTER — Encounter (HOSPITAL_COMMUNITY): Payer: Self-pay

## 2020-10-10 DIAGNOSIS — S41152A Open bite of left upper arm, initial encounter: Secondary | ICD-10-CM | POA: Insufficient documentation

## 2020-10-10 DIAGNOSIS — F17210 Nicotine dependence, cigarettes, uncomplicated: Secondary | ICD-10-CM | POA: Insufficient documentation

## 2020-10-10 DIAGNOSIS — R200 Anesthesia of skin: Secondary | ICD-10-CM | POA: Insufficient documentation

## 2020-10-10 DIAGNOSIS — S81852A Open bite, left lower leg, initial encounter: Secondary | ICD-10-CM | POA: Insufficient documentation

## 2020-10-10 DIAGNOSIS — Z23 Encounter for immunization: Secondary | ICD-10-CM | POA: Insufficient documentation

## 2020-10-10 DIAGNOSIS — W540XXA Bitten by dog, initial encounter: Secondary | ICD-10-CM | POA: Insufficient documentation

## 2020-10-10 MED ORDER — OXYCODONE-ACETAMINOPHEN 5-325 MG PO TABS
1.0000 | ORAL_TABLET | ORAL | Status: DC | PRN
  Administered 2020-10-11: 1 via ORAL
  Filled 2020-10-10: qty 1

## 2020-10-10 NOTE — ED Triage Notes (Signed)
Pt comes via [facility] for dog bite to L lower leg appx 2 in lac and L forearm puncture wound, bleeding controlled, unknown is dog is vaccinated, last tetanus unknown.

## 2020-10-11 ENCOUNTER — Emergency Department (HOSPITAL_COMMUNITY): Payer: Self-pay

## 2020-10-11 MED ORDER — DOXYCYCLINE HYCLATE 100 MG PO TABS
100.0000 mg | ORAL_TABLET | Freq: Once | ORAL | Status: AC
  Administered 2020-10-11: 100 mg via ORAL
  Filled 2020-10-11: qty 1

## 2020-10-11 MED ORDER — LIDOCAINE-EPINEPHRINE 1 %-1:100000 IJ SOLN
20.0000 mL | Freq: Once | INTRAMUSCULAR | Status: AC
  Administered 2020-10-11: 20 mL
  Filled 2020-10-11: qty 1

## 2020-10-11 MED ORDER — OXYCODONE-ACETAMINOPHEN 5-325 MG PO TABS
1.0000 | ORAL_TABLET | Freq: Once | ORAL | Status: AC
  Administered 2020-10-11: 1 via ORAL
  Filled 2020-10-11: qty 1

## 2020-10-11 MED ORDER — FENTANYL CITRATE (PF) 100 MCG/2ML IJ SOLN
100.0000 ug | Freq: Once | INTRAMUSCULAR | Status: AC
  Administered 2020-10-11: 100 ug via INTRAMUSCULAR
  Filled 2020-10-11: qty 2

## 2020-10-11 MED ORDER — OXYCODONE HCL 5 MG PO TABS: 5.0000 mg | ORAL_TABLET | Freq: Four times a day (QID) | ORAL | 0 refills | Status: AC | PRN

## 2020-10-11 MED ORDER — TETANUS-DIPHTH-ACELL PERTUSSIS 5-2.5-18.5 LF-MCG/0.5 IM SUSY
0.5000 mL | PREFILLED_SYRINGE | Freq: Once | INTRAMUSCULAR | Status: AC
  Administered 2020-10-11: 0.5 mL via INTRAMUSCULAR
  Filled 2020-10-11: qty 0.5

## 2020-10-11 MED ORDER — DOXYCYCLINE HYCLATE 100 MG PO CAPS: 100.0000 mg | ORAL_CAPSULE | Freq: Two times a day (BID) | ORAL | 0 refills | Status: AC

## 2020-10-11 NOTE — ED Notes (Signed)
Patient verbalizes understanding of discharge instructions. Opportunity for questioning and answers were provided. Armband removed by staff, pt discharged from ED via wheelchair.  

## 2020-10-11 NOTE — Discharge Instructions (Addendum)
Thank you for allowing me to care for you today in the Emergency Department.   You were seen today for a dog bite.  Animal control should follow up with you regarding the dog's immunization status since it is currently in their custody.  Take 650 mg of Tylenol or 600 mg of ibuprofen with food every 6 hours for pain.  You can alternate between these 2 medications every 3 hours if your pain returns.  For instance, you can take Tylenol at noon, followed by a dose of ibuprofen at 3, followed by second dose of Tylenol and 6.  For severe, uncontrollable pain, you can take 1 tablet of oxycodone every 6 hours as needed.  Please only use this if your pain is unbearable despite taking ibuprofen and Tylenol.  It is a narcotic.  It can be addicting.  Do not take with other sedating substances, such as alcohol, as this may make you too drowsy.  You should not work or drive while taking this medication.  Take 1 tablet of doxycycline 2 times daily for the next week.  Your first dose was given in the emergency department.  Keep the wound clean and dry for the next 24 hours.  Then, gently clean all of your wounds with warm water and soap at least once daily.  Pat the areas dry then apply a gauze dressing or Band-Aid over the area.  Make sure to change the dressing daily.  You can also apply a topical antibiotic such as bacitracin or Neosporin directly to the skin.  You need to have the wound reevaluated if you develop fevers, chills, red streaking, if the wound gets significantly red, hot to the touch, and swollen, or start to have thick, mucus-like drainage.  He should also return to the emergency department for reevaluation if your calf in your left lower leg gets very hard and extremely painful to the touch.  Your sutures need to be removed in 10 to 14 days.  You can return to the ER, go to urgent care, or follow-up with primary care for suture removal.  You can also follow-up with plastic surgery for reevaluation of  the wound and scar in the future.

## 2020-10-11 NOTE — ED Provider Notes (Signed)
[facility] EMERGENCY DEPARTMENT Provider Note   CSN: [id number] Arrival date & time: 10/10/20  2240     History Chief Complaint  Patient presents with  . Animal Bite    Gabriel Mcgee is a 50 y.o. male with a history of tobacco use disorder who presents to the emergency department via EMS with a chief complaint of dog bite.  The patient was visiting a friend earlier tonight when he was bitten by his friend's pitbull.  He sustained wounds to his left forearm and left lower leg.  He is unsure when his Tdap was last updated.  He states that his friend said the dog's vaccinations are up-to-date, but he is unsure if this is accurate.  The dog is currently being quarantined with animal control.  He reports some numbness to the left lower leg.  He denies weakness, ankle or knee pain, chest pain, shortness of breath, fever, chills, red streaking, redness, or warmth to the wound.  No treatment prior to arrival.  The history is provided by the patient, medical records and a significant other. No language interpreter was used.       Past Medical History:  Diagnosis Date  . MI (myocardial infarction) (HCC)     There are no problems to display for this patient.   History reviewed. No pertinent surgical history.     No family history on file.  Social History   Tobacco Use  . Smoking status: Current Every Day Smoker    Packs/day: 0.30    Types: Cigarettes  Substance Use Topics  . Alcohol use: Yes    Comment: social  . Drug use: No    Home Medications Prior to Admission medications   Medication Sig Start Date End Date Taking? Authorizing Provider  doxycycline (VIBRAMYCIN) 100 MG capsule Take 1 capsule (100 mg total) by mouth 2 (two) times daily for 7 days. 10/11/20 10/18/20 Yes Nikesh Teschner A, PA-C  oxyCODONE (ROXICODONE) 5 MG immediate release tablet Take 1 tablet (5 mg total) by mouth every 6 (six) hours as needed for severe pain. 10/11/20  Yes Sarajane Fambrough A, PA-C   Acetaminophen (TYLENOL PO) Take 2 capsules by mouth once.    [provider]  benzonatate (TESSALON) 100 MG capsule Take 1 capsule (100 mg total) by mouth every 8 (eight) hours. 04/24/20   Harlene Salts A, PA-C  ibuprofen (ADVIL,MOTRIN) 200 MG tablet Take 600 mg by mouth daily as needed (pain).    [provider]  Multiple Vitamin (MULTIVITAMIN WITH MINERALS) TABS tablet Take 1 tablet by mouth daily.    [provider]    Allergies    Cinnamon  Review of Systems   Review of Systems  Constitutional: Negative for activity change, chills, diaphoresis and fever.  Eyes: Negative for visual disturbance.  Respiratory: Negative for shortness of breath and wheezing.   Cardiovascular: Negative for chest pain and palpitations.  Gastrointestinal: Negative for abdominal pain, constipation, diarrhea, nausea and vomiting.  Genitourinary: Negative for frequency and urgency.  Musculoskeletal: Negative for back pain, myalgias, neck pain and neck stiffness.  Skin: Positive for wound. Negative for color change and rash.  Neurological: Positive for numbness. Negative for dizziness, seizures, syncope and weakness.    Physical Exam Updated Vital Signs BP (!) 142/90 (BP Location: Right Arm)   Pulse 87   Temp 97.6 F (36.4 C) (Oral)   Resp 17   SpO2 97%   Physical Exam Vitals and nursing note reviewed.  Constitutional:  General: He is not in acute distress.    Appearance: He is well-developed. He is not ill-appearing, toxic-appearing or diaphoretic.  HENT:     Head: Normocephalic.  Eyes:     Conjunctiva/sclera: Conjunctivae normal.  Cardiovascular:     Rate and Rhythm: Normal rate and regular rhythm.     Heart sounds: No murmur heard.   Pulmonary:     Effort: Pulmonary effort is normal.  Abdominal:     General: There is no distension.     Palpations: Abdomen is soft. There is no mass.     Tenderness: There is no abdominal tenderness. There is no right CVA  tenderness, left CVA tenderness, guarding or rebound.     Hernia: No hernia is present.  Musculoskeletal:     Cervical back: Neck supple.  Skin:    General: Skin is warm and dry.     Comments: See photos below  There is a 4 cm, jagged, gaping laceration noted to the left lower leg.  There are also multiple superficial abrasions and a puncture wound noted to the left lower leg. Wounds are hemostatic.  No foreign bodies.  A decreased sensation bilaterally to the left lower leg as compared to the right.   Good strength against resistance.  DP and PT pulses are 2+ and symmetric.  Good capillary refill.  Lower extremity is well-perfused.  Large ecchymotic area noted to the left forearm with superficial abrasions.  No lacerations.  Neurological:     Mental Status: He is alert.  Psychiatric:        Behavior: Behavior normal.          ED Results / Procedures / Treatments   Labs (all labs ordered are listed, but only abnormal results are displayed) Labs Reviewed - No data to display  EKG None  Radiology No results found.  Procedures .Marland KitchenLaceration Repair  Date/Time: 10/11/2020 6:29 AM Performed by: Barkley Boards, PA-C Authorized by: Barkley Boards, PA-C   Consent:    Consent obtained:  Verbal   Consent given by:  Patient   Risks discussed:  Infection   Alternatives discussed:  No treatment, observation and referral Universal protocol:    Patient identity confirmed:  Verbally with patient and provided demographic data Anesthesia:    Anesthesia method:  Local infiltration   Local anesthetic:  Lidocaine 2% WITH epi Laceration details:    Location:  Leg   Leg location:  L lower leg   Length (cm):  4 Pre-procedure details:    Preparation:  Patient was prepped and draped in usual sterile fashion and imaging obtained to evaluate for foreign bodies Exploration:    Hemostasis achieved with:  Direct pressure   Imaging outcome: foreign body not noted     Wound exploration: wound  explored through full range of motion and entire depth of wound visualized     Wound extent: fascia violated and nerve damage     Wound extent: no foreign bodies/material noted, no muscle damage noted, no tendon damage noted, no underlying fracture noted and no vascular damage noted     Contaminated: no   Treatment:    Area cleansed with:  Shur-Clens   Amount of cleaning:  Extensive   Irrigation method:  Pressure wash   Visualized foreign bodies/material removed: no   Skin repair:    Repair method:  Sutures   Suture size:  3-0   Suture material:  Prolene   Suture technique:  Horizontal mattress   Number of sutures:  4 Approximation:    Approximation:  Loose Repair type:    Repair type:  Intermediate Post-procedure details:    Dressing:  Sterile dressing, antibiotic ointment and bulky dressing   Procedure completion:  Tolerated well, no immediate complications     Medications Ordered in ED Medications  oxyCODONE-acetaminophen (PERCOCET/ROXICET) 5-325 MG per tablet 1 tablet (1 tablet Oral Given 10/11/20 0002)  fentaNYL (SUBLIMAZE) injection 100 mcg (100 mcg Intramuscular Given 10/11/20 0532)  lidocaine-EPINEPHrine (XYLOCAINE W/EPI) 1 %-1:100000 (with pres) injection 20 mL (20 mLs Infiltration Given 10/11/20 0538)  Tdap (BOOSTRIX) injection 0.5 mL (0.5 mLs Intramuscular Given 10/11/20 0533)  oxyCODONE-acetaminophen (PERCOCET/ROXICET) 5-325 MG per tablet 1 tablet (1 tablet Oral Given 10/11/20 0635)  doxycycline (VIBRA-TABS) tablet 100 mg (100 mg Oral Given 10/11/20 4481)    ED Course  I have reviewed the triage vital signs and the nursing notes.  Pertinent labs & imaging results that were available during my care of the patient were reviewed by me and considered in my medical decision making (see chart for details).    MDM Rules/Calculators/A&P                          50 year old male who presents to the emergency department by EMS with a dog. Vital signs are stable.  The patient was  seen and independently evaluated by Dr. Blinda Leatherwood, attending physician.  The wound of the left lower leg is gaping and will require repair.  X-ray of the left lower leg was ordered, but unfortunately there were systemwide issues with reviewing x-rays and having the x-rays crossed over throughout the patient's admission.  However, I did review the patient's wound at the base of the wound in a bloodless field and did not see any obvious foreign bodies.  There were 4 horizontal sutures placed with loose approximation of the wound.  Patient was given his first dose of doxycycline the emergency department.  He will be given a referral to plastic surgery he has concerns about cosmetic appearance.  We will also discharge him home with a short course of pain medication.  ER return precautions given.  He is hemodynamically stable no acute distress.  Safe for discharge to home with outpatient follow-up as needed.    Final Clinical Impression(s) / ED Diagnoses Final diagnoses:  Dog bite of left lower leg, initial encounter  Dog bite of left upper extremity, initial encounter    Rx / DC Orders ED Discharge Orders         Ordered    oxyCODONE (ROXICODONE) 5 MG immediate release tablet  Every 6 hours PRN        10/11/20 0618    doxycycline (VIBRAMYCIN) 100 MG capsule  2 times daily        10/11/20 0618           Baily Serpe A, PA-C 10/11/20 8563    Gilda Crease, MD 10/12/20 [phone]

## 2020-11-06 ENCOUNTER — Institutional Professional Consult (permissible substitution): Payer: Self-pay | Admitting: Surgical

## 2021-01-02 ENCOUNTER — Emergency Department (HOSPITAL_COMMUNITY): Payer: Self-pay

## 2021-01-02 ENCOUNTER — Emergency Department (HOSPITAL_COMMUNITY)
Admission: EM | Admit: 2021-01-02 | Discharge: 2021-01-02 | Disposition: A | Discharge status: Home / Self Care | Payer: Self-pay | Attending: Emergency Medicine | Admitting: Emergency Medicine

## 2021-01-02 DIAGNOSIS — F17210 Nicotine dependence, cigarettes, uncomplicated: Secondary | ICD-10-CM | POA: Insufficient documentation

## 2021-01-02 DIAGNOSIS — R0789 Other chest pain: Secondary | ICD-10-CM | POA: Insufficient documentation

## 2021-01-02 LAB — COMPREHENSIVE METABOLIC PANEL
ALT: 24 U/L (ref 0–44)
AST: 29 U/L (ref 15–41)
Albumin: 3.6 g/dL (ref 3.5–5.0)
Alkaline Phosphatase: 53 U/L (ref 38–126)
Anion gap: 16 — ABNORMAL HIGH (ref 5–15)
BUN: 11 mg/dL (ref 6–20)
CO2: 18 mmol/L — ABNORMAL LOW (ref 22–32)
Calcium: 8.3 mg/dL — ABNORMAL LOW (ref 8.9–10.3)
Chloride: 98 mmol/L (ref 98–111)
Creatinine, Ser: 1.02 mg/dL (ref 0.61–1.24)
GFR, Estimated: >60 mL/min (ref >60)
Glucose, Bld: 122 mg/dL — ABNORMAL HIGH (ref 70–99)
Potassium: 3.2 mmol/L — ABNORMAL LOW (ref 3.5–5.1)
Sodium: 132 mmol/L — ABNORMAL LOW (ref 135–145)
Total Bilirubin: 1.3 mg/dL — ABNORMAL HIGH (ref 0.3–1.2)
Total Protein: 6.1 g/dL — ABNORMAL LOW (ref 6.5–8.1)

## 2021-01-02 LAB — CBC WITH DIFFERENTIAL/PLATELET
Abs Immature Granulocytes: 0.03 10*3/uL (ref 0.00–0.07)
Basophils Absolute: 0 10*3/uL (ref 0.0–0.1)
Basophils Relative: 0 %
Eosinophils Absolute: 0 10*3/uL (ref 0.0–0.5)
Eosinophils Relative: 0 %
HCT: 47.1 % (ref 39.0–52.0)
Hemoglobin: 15.9 g/dL (ref 13.0–17.0)
Immature Granulocytes: 0 %
Lymphocytes Relative: 14 %
Lymphs Abs: 1.7 10*3/uL (ref 0.7–4.0)
MCH: 31 pg (ref 26.0–34.0)
MCHC: 33.8 g/dL (ref 30.0–36.0)
MCV: 91.8 fL (ref 80.0–100.0)
Monocytes Absolute: 0.7 10*3/uL (ref 0.1–1.0)
Monocytes Relative: 6 %
Neutro Abs: 9.7 10*3/uL — ABNORMAL HIGH (ref 1.7–7.7)
Neutrophils Relative %: 80 %
Platelets: 210 10*3/uL (ref 150–400)
RBC: 5.13 MIL/uL (ref 4.22–5.81)
RDW: 13.8 % (ref 11.5–15.5)
WBC: 12.2 10*3/uL — ABNORMAL HIGH (ref 4.0–10.5)
nRBC: 0 % (ref 0.0–0.2)

## 2021-01-02 LAB — LIPASE, BLOOD: Lipase: 26 U/L (ref 11–51)

## 2021-01-02 LAB — TROPONIN I (HIGH SENSITIVITY): Troponin I (High Sensitivity): 6 ng/L (ref <18)

## 2021-01-02 MED ORDER — KETOROLAC TROMETHAMINE 30 MG/ML IJ SOLN
30.0000 mg | Freq: Once | INTRAMUSCULAR | Status: AC
  Administered 2021-01-02: 30 mg via INTRAVENOUS
  Filled 2021-01-02: qty 1

## 2021-01-02 MED ORDER — SODIUM CHLORIDE 0.9 % IV SOLN: INTRAVENOUS | Status: DC

## 2021-01-02 NOTE — ED Notes (Signed)
All appropriate discharge materials reviewed at length with patient. Time for questions provided. Pt has no other questions at this time and verbalizes understanding of all provided materials.  

## 2021-01-02 NOTE — ED Provider Notes (Signed)
[facility] EMERGENCY DEPARTMENT Provider Note   CSN: [id number] Arrival date & time: 01/02/21  2039     History No chief complaint on file.   Gabriel Mcgee is a 50 y.o. male.  50 year old male presents with chest discomfort which is been persistent for the past 8 hours.  Patient admits to drinking alcohol today.  He also has a history of cocaine use.  States he was going to bed this evening when all of a sudden the pain became worse when he turned to certain position.  Notes it does get worse when he is a deep breath at time.  Denies any fever or cough.  The pain makes him feel short of breath but he denies any resting dyspnea.  No nausea or vomiting.  No diaphoresis.  No associated leg pain or swelling.  No dyspnea on exertion.  Pain is somewhat better also with remaining still.  EMS called and patient given morphine nitroglycerin and aspirin.  Notes some improvement of his symptoms at this time.  Is any priors of coronary disease.        Past Medical History:  Diagnosis Date  . MI (myocardial infarction) (HCC)     There are no problems to display for this patient.   No past surgical history on file.     No family history on file.  Social History   Tobacco Use  . Smoking status: Current Every Day Smoker    Packs/day: 0.30    Types: Cigarettes  Substance Use Topics  . Alcohol use: Yes    Comment: social  . Drug use: No    Home Medications Prior to Admission medications   Medication Sig Start Date End Date Taking? Authorizing Provider  Acetaminophen (TYLENOL PO) Take 2 capsules by mouth once.    [provider]  benzonatate (TESSALON) 100 MG capsule Take 1 capsule (100 mg total) by mouth every 8 (eight) hours. 04/24/20   Harlene Salts A, PA-C  ibuprofen (ADVIL,MOTRIN) 200 MG tablet Take 600 mg by mouth daily as needed (pain).    [provider]  Multiple Vitamin (MULTIVITAMIN WITH MINERALS) TABS tablet Take 1 tablet by mouth  daily.    [provider]  oxyCODONE (ROXICODONE) 5 MG immediate release tablet Take 1 tablet (5 mg total) by mouth every 6 (six) hours as needed for severe pain. 10/11/20   McDonald, Mia A, PA-C    Allergies    Cinnamon  Review of Systems   Review of Systems  All other systems reviewed and are negative.   Physical Exam Updated Vital Signs There were no vitals taken for this visit.  Physical Exam Vitals and nursing note reviewed.  Constitutional:      General: He is not in acute distress.    Appearance: Normal appearance. He is well-developed. He is not toxic-appearing.  HENT:     Head: Normocephalic and atraumatic.  Eyes:     General: Lids are normal.     Conjunctiva/sclera: Conjunctivae normal.     Pupils: Pupils are equal, round, and reactive to light.  Neck:     Thyroid: No thyroid mass.     Trachea: No tracheal deviation.  Cardiovascular:     Rate and Rhythm: Normal rate and regular rhythm.     Heart sounds: Normal heart sounds. No murmur heard. No gallop.   Pulmonary:     Effort: Pulmonary effort is normal. No respiratory distress.     Breath sounds: Normal breath sounds. No  stridor. No decreased breath sounds, wheezing, rhonchi or rales.  Chest:    Abdominal:     General: Bowel sounds are normal. There is no distension.     Palpations: Abdomen is soft.     Tenderness: There is no abdominal tenderness. There is no rebound.  Musculoskeletal:        General: No tenderness. Normal range of motion.     Cervical back: Normal range of motion and neck supple.  Skin:    General: Skin is warm and dry.     Findings: No abrasion or rash.  Neurological:     Mental Status: He is alert and oriented to person, place, and time.     GCS: GCS eye subscore is 4. GCS verbal subscore is 5. GCS motor subscore is 6.     Cranial Nerves: No cranial nerve deficit.     Sensory: No sensory deficit.  Psychiatric:        Speech: Speech normal.        Behavior: Behavior normal.      ED Results / Procedures / Treatments   Labs (all labs ordered are listed, but only abnormal results are displayed) Labs Reviewed - No data to display  EKG None  Radiology No results found.  Procedures Procedures   Medications Ordered in ED Medications  0.9 %  sodium chloride infusion (has no administration in time range)    ED Course  I have reviewed the triage vital signs and the nursing notes.  Pertinent labs & imaging results that were available during my care of the patient were reviewed by me and considered in my medical decision making (see chart for details).    MDM Rules/Calculators/A&P                          Patient with negative troponin after experiencing more than 6 hours of pain.  EKG without acute findings.  Chest x-ray normal 2.  Pain is reproducible with palpation.  Given Toradol with relief.  Will discharge home Final Clinical Impression(s) / ED Diagnoses Final diagnoses:  None    Rx / DC Orders ED Discharge Orders    None       Lorre Nick, MD 01/02/21 2321

## 2021-01-02 NOTE — ED Triage Notes (Signed)
Patient arrives to ED BIB [facility] due to CP . Per EMS pt has been experiencing Central CP that radiates to Lf Arm. Pain 9/10. EMS administered 4mg  Zofran IV, 324mg  Aspirin, 2 Sublingual Nitro, 6mg  morphine with no relief. Patient A/O x4  EDP at bedside upon patietns arrival.

## 2021-10-02 IMAGING — DX DG CHEST 1V PORT
1 series · 2 of 2 positions shown · non-contrast
Comparison: Chest radiograph dated 04/24/2020.

CLINICAL DATA: 50-year-old male with chest pain.

EXAM:
PORTABLE CHEST 1 VIEW

[Series 1: chest · 0.14mm/px · 2 of 2 slices shown]
[im 1/2]
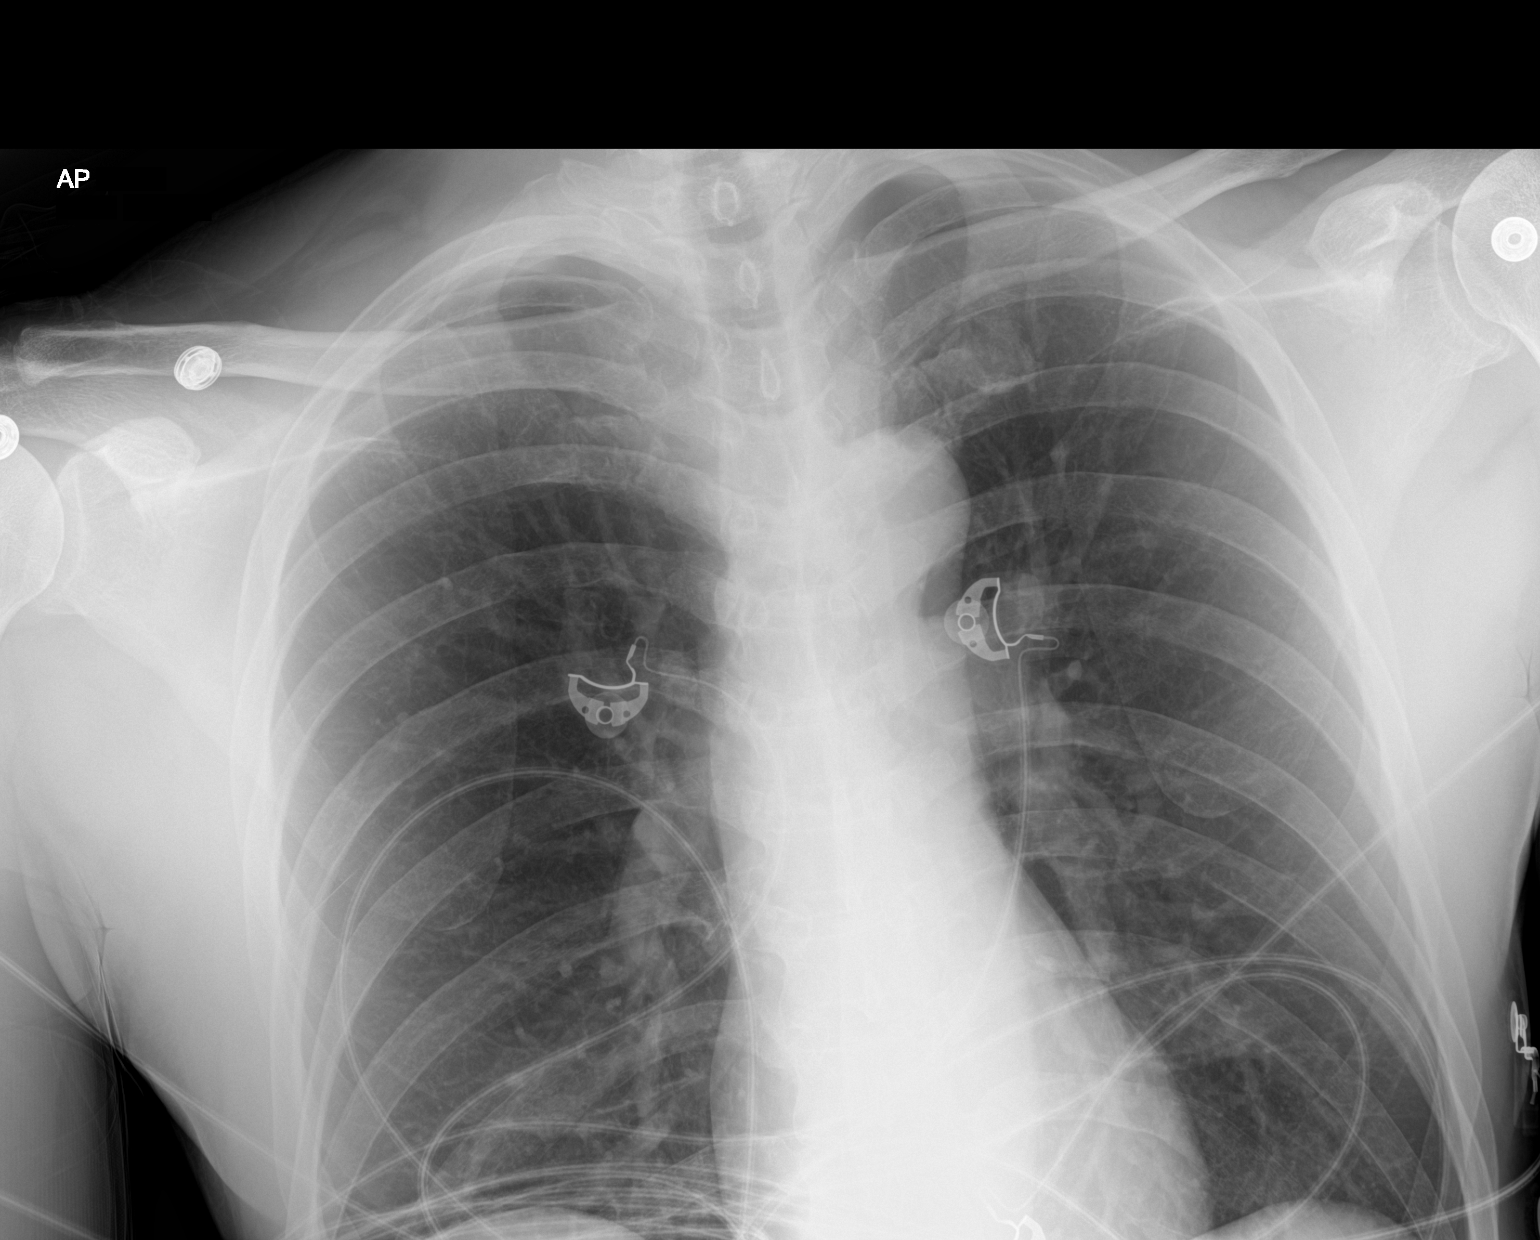
[im 2/2]
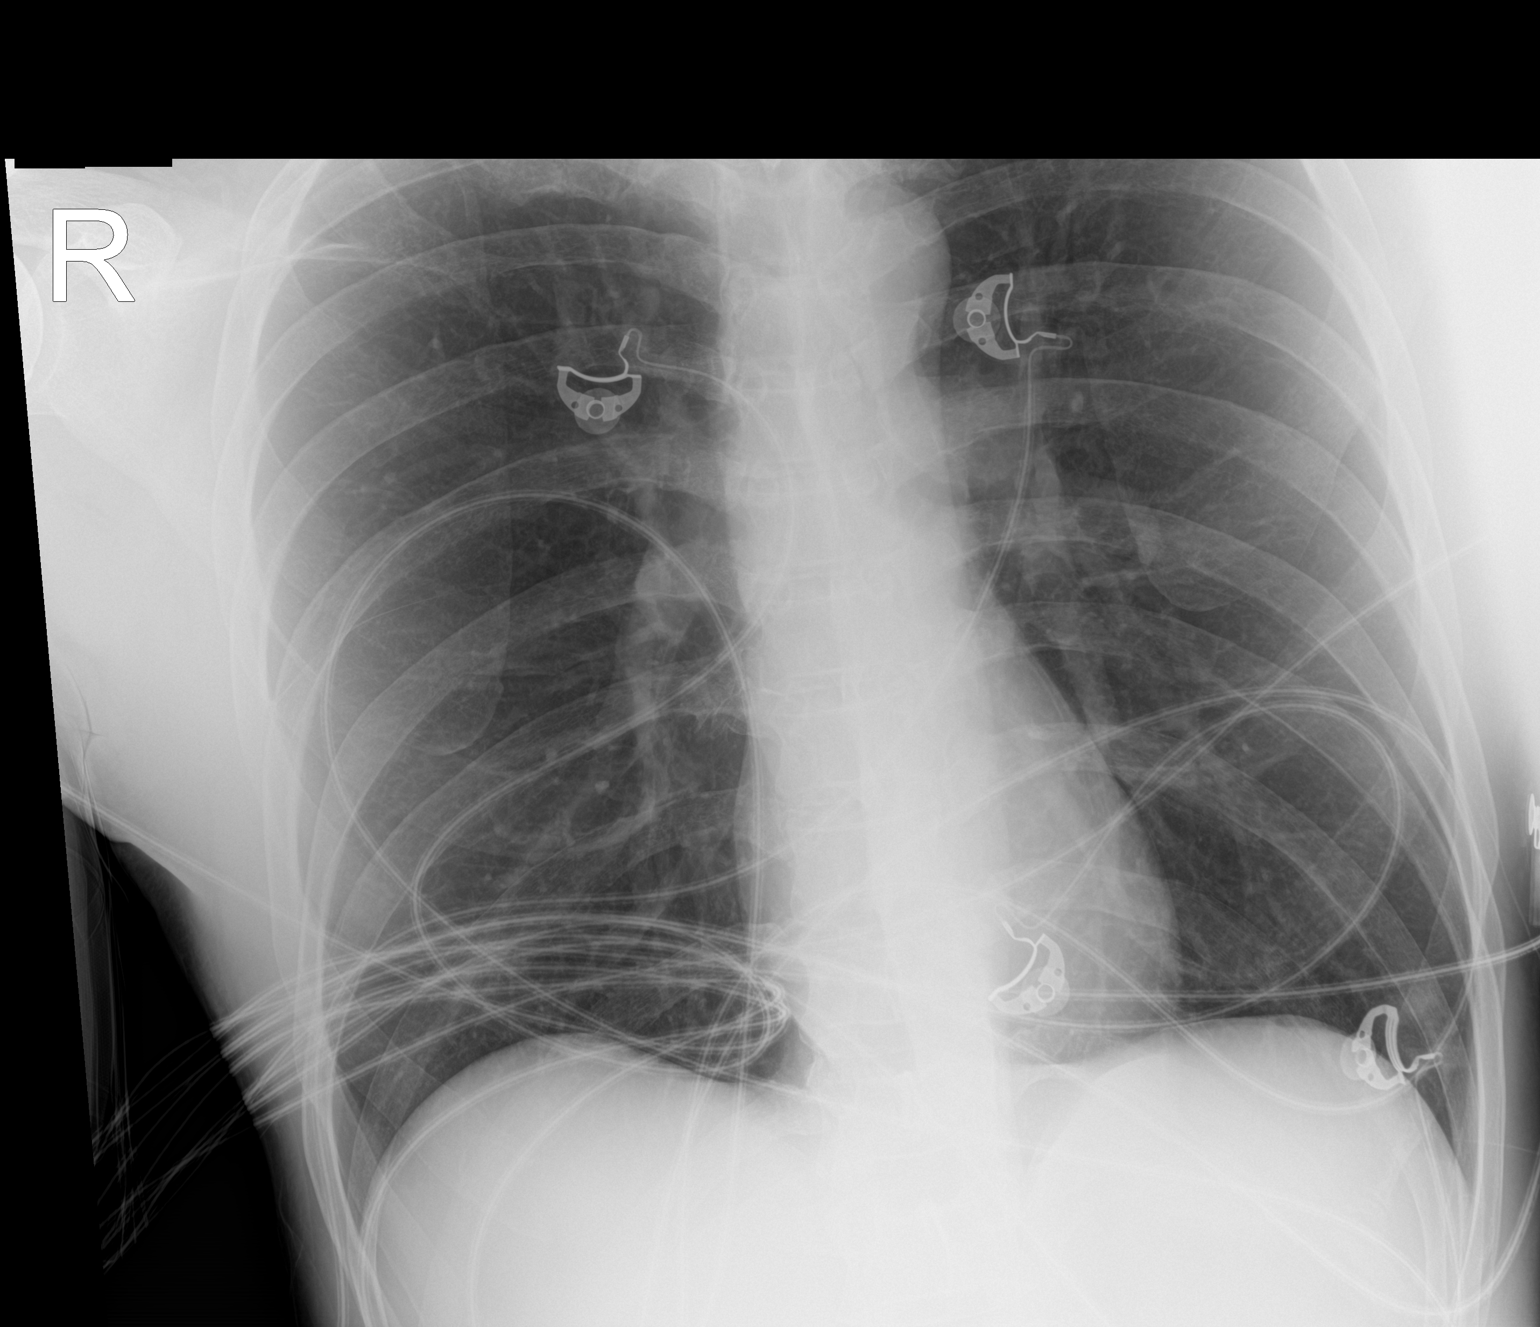

[2 of 2 positions shown; findings below may reference images not displayed]

FINDINGS: The heart size and mediastinal contours are within normal limits.
Both lungs are clear. The visualized skeletal structures are
unremarkable.
IMPRESSION: No active disease.
